# Patient Record
Sex: Male | Born: 1990 | Race: White | Hispanic: No | Marital: Married | Smoking: Former smoker
Health system: Southern US, Community
[De-identification: ages and names within clinical notes are randomized; demographics above are authoritative.]

## PROBLEM LIST (undated history)

## (undated) DIAGNOSIS — F1111 Opioid abuse, in remission: Secondary | ICD-10-CM

## (undated) HISTORY — DX: Opioid abuse, in remission: F11.11

---

## 2018-11-20 ENCOUNTER — Other Ambulatory Visit: Payer: Self-pay | Admitting: Primary Care

## 2018-11-20 ENCOUNTER — Ambulatory Visit (INDEPENDENT_AMBULATORY_CARE_PROVIDER_SITE_OTHER): Payer: No Typology Code available for payment source | Admitting: Primary Care

## 2018-11-20 ENCOUNTER — Encounter: Payer: Self-pay | Admitting: Primary Care

## 2018-11-20 ENCOUNTER — Other Ambulatory Visit: Payer: Self-pay

## 2018-11-20 VITALS — BP 116/82 | HR 76 | Temp 97.9°F | Ht 75.75 in | Wt 193.2 lb

## 2018-11-20 DIAGNOSIS — R7989 Other specified abnormal findings of blood chemistry: Secondary | ICD-10-CM

## 2018-11-20 DIAGNOSIS — Z0000 Encounter for general adult medical examination without abnormal findings: Secondary | ICD-10-CM | POA: Insufficient documentation

## 2018-11-20 LAB — COMPREHENSIVE METABOLIC PANEL
ALT: 439 U/L — ABNORMAL HIGH (ref 0–53)
AST: 251 U/L — ABNORMAL HIGH (ref 0–37)
Albumin: 4.7 g/dL (ref 3.5–5.2)
Alkaline Phosphatase: 87 U/L (ref 39–117)
BUN: 17 mg/dL (ref 6–23)
CO2: 29 mEq/L (ref 19–32)
Calcium: 9.3 mg/dL (ref 8.4–10.5)
Chloride: 103 mEq/L (ref 96–112)
Creatinine, Ser: 1 mg/dL (ref 0.40–1.50)
GFR: 89.49 mL/min (ref >60.00)
Glucose, Bld: 90 mg/dL (ref 70–99)
Potassium: 4.5 mEq/L (ref 3.5–5.1)
Sodium: 140 mEq/L (ref 135–145)
Total Bilirubin: 0.9 mg/dL (ref 0.2–1.2)
Total Protein: 7 g/dL (ref 6.0–8.3)

## 2018-11-20 LAB — LIPID PANEL
Cholesterol: 111 mg/dL (ref 0–200)
HDL: 48.3 mg/dL (ref >39.00)
LDL Cholesterol: 53 mg/dL (ref 0–99)
NonHDL: 62.86
Total CHOL/HDL Ratio: 2
Triglycerides: 50 mg/dL (ref 0.0–149.0)
VLDL: 10 mg/dL (ref 0.0–40.0)

## 2018-11-20 NOTE — Patient Instructions (Signed)
Stop by the lab prior to leaving today. I will notify you of your results once received.   Continue exercising. You should be getting 150 minutes of moderate intensity exercise weekly.  Continue to eat a healthy diet.   Ensure you are consuming 64 ounces of water daily.  It was a pleasure to meet you today! Please don't hesitate to call or message me with any questions. Welcome to [facility]!   Preventive Care 49-27 Years Old, Male Preventive care refers to lifestyle choices and visits with your health care provider that can promote health and wellness. This includes:  A yearly physical exam. This is also called an annual well check.  Regular dental and eye exams.  Immunizations.  Screening for certain conditions.  Healthy lifestyle choices, such as eating a healthy diet, getting regular exercise, not using drugs or products that contain nicotine and tobacco, and limiting alcohol use. What can I expect for my preventive care visit? Physical exam Your health care provider will check:  Height and weight. These may be used to calculate body mass index (BMI), which is a measurement that tells if you are at a healthy weight.  Heart rate and blood pressure.  Your skin for abnormal spots. Counseling Your health care provider may ask you questions about:  Alcohol, tobacco, and drug use.  Emotional well-being.  Home and relationship well-being.  Sexual activity.  Eating habits.  Work and work Statistician. What immunizations do I need?  Influenza (flu) vaccine  This is recommended every year. Tetanus, diphtheria, and pertussis (Tdap) vaccine  You may need a Td booster every 10 years. Varicella (chickenpox) vaccine  You may need this vaccine if you have not already been vaccinated. Human papillomavirus (HPV) vaccine  If recommended by your health care provider, you may need three doses over 6 months. Measles, mumps, and rubella (MMR) vaccine  You may need at least  one dose of MMR. You may also need a second dose. Meningococcal conjugate (MenACWY) vaccine  One dose is recommended if you are 90-85 years old and a Market researcher living in a residence hall, or if you have one of several medical conditions. You may also need additional booster doses. Pneumococcal conjugate (PCV13) vaccine  You may need this if you have certain conditions and were not previously vaccinated. Pneumococcal polysaccharide (PPSV23) vaccine  You may need one or two doses if you smoke cigarettes or if you have certain conditions. Hepatitis A vaccine  You may need this if you have certain conditions or if you travel or work in places where you may be exposed to hepatitis A. Hepatitis B vaccine  You may need this if you have certain conditions or if you travel or work in places where you may be exposed to hepatitis B. Haemophilus influenzae type b (Hib) vaccine  You may need this if you have certain risk factors. You may receive vaccines as individual doses or as more than one vaccine together in one shot (combination vaccines). Talk with your health care provider about the risks and benefits of combination vaccines. What tests do I need? Blood tests  Lipid and cholesterol levels. These may be checked every 5 years starting at age 54.  Hepatitis C test.  Hepatitis B test. Screening   Diabetes screening. This is done by checking your blood sugar (glucose) after you have not eaten for a while (fasting).  Sexually transmitted disease (STD) testing. Talk with your health care provider about your test results, treatment options, and  if necessary, the need for more tests. Follow these instructions at home: Eating and drinking   Eat a diet that includes fresh fruits and vegetables, whole grains, lean protein, and low-fat dairy products.  Take vitamin and mineral supplements as recommended by your health care provider.  Do not drink alcohol if your health care  provider tells you not to drink.  If you drink alcohol: ? Limit how much you have to 0-2 drinks a day. ? Be aware of how much alcohol is in your drink. In the [place] one drink equals one 12 oz bottle of beer (355 mL), one 5 oz glass of wine (148 mL), or one 1 oz glass of hard liquor (44 mL). Lifestyle  Take daily care of your teeth and gums.  Stay active. Exercise for at least 30 minutes on 5 or more days each week.  Do not use any products that contain nicotine or tobacco, such as cigarettes, e-cigarettes, and chewing tobacco. If you need help quitting, ask your health care provider.  If you are sexually active, practice safe sex. Use a condom or other form of protection to prevent STIs (sexually transmitted infections). What's next?  Go to your health care provider once a year for a well check visit.  Ask your health care provider how often you should have your eyes and teeth checked.  Stay up to date on all vaccines. This information is not intended to replace advice given to you by your health care provider. Make sure you discuss any questions you have with your health care provider. Document Released: 06/08/2001 Document Revised: 04/06/2018 Document Reviewed: 04/06/2018 Elsevier Patient Education  2020 [employer].

## 2018-11-20 NOTE — Progress Notes (Signed)
Subjective:    Patient ID: Carl Carson, male    DOB: 1990/10/01, 28 y.o.   MRN: [id number]  HPI  Carl Carson is a 28 year old male who presents today to establish care and for complete physical. Will obtain/review records.  Immunizations: -Tetanus: Unsure, believes it's been within 10 years.  -Influenza: Due this season   Diet: He endorses a healthy diet. He endorses plenty of vegetables, fruit, whole grains, lean protein. Exercise: He is starting to run again. 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   Eye exam: Completed in 2019 Dental exam: Completes annually as of recent.   BP Readings from Last 3 Encounters:  11/20/18 116/82     Review of Systems  Constitutional: Negative for unexpected weight change.  HENT: Negative for rhinorrhea.   Respiratory: Negative for cough and shortness of breath.   Cardiovascular: Negative for chest pain.  Gastrointestinal: Negative for constipation and diarrhea.  Genitourinary: Negative for difficulty urinating.  Musculoskeletal: Negative for arthralgias and myalgias.  Skin: Negative for rash.  Allergic/Immunologic: Negative for environmental allergies.  Neurological: Negative for dizziness and headaches.  Psychiatric/Behavioral: The patient is not nervous/anxious.        History reviewed. No pertinent past medical history.   Social History   Socioeconomic History  . Marital status: Married    Spouse name: Not on file  . Number of children: Not on file  . Years of education: Not on file  . Highest education level: Not on file  Occupational History  . Not on file  Social Needs  . Financial resource strain: Not on file  . Food insecurity    Worry: Not on file    Inability: Not on file  . Transportation needs    Medical: Not on file    Non-medical: Not on file  Tobacco Use  . Smoking status: Former Research scientist (life sciences)  . Smokeless tobacco: Never Used  Substance and Sexual Activity  . Alcohol use: Yes  . Drug use: Not on  file  . Sexual activity: Not on file  Lifestyle  . Physical activity    Days per week: Not on file    Minutes per session: Not on file  . Stress: Not on file  Relationships  . Social Herbalist on phone: Not on file    Gets together: Not on file    Attends religious service: Not on file    Active member of club or organization: Not on file    Attends meetings of clubs or organizations: Not on file    Relationship status: Not on file  . Intimate partner violence    Fear of current or ex partner: Not on file    Emotionally abused: Not on file    Physically abused: Not on file    Forced sexual activity: Not on file  Other Topics Concern  . Not on file  Social History Narrative   Married.   No children.   Works as a Chief Strategy Officer, also to be starting with Research officer, trade union.     History reviewed. No pertinent surgical history.  Family History  Problem Relation Age of Onset  . Supraventricular tachycardia Mother   . Hypertension Father     No Known Allergies  Current Outpatient Medications on File Prior to Visit  Medication Sig Dispense Refill  . Multiple Vitamin (MULTIVITAMIN) tablet Take 1 tablet by mouth daily.     No current facility-administered medications on file prior to visit.  BP 116/82   Pulse 76   Temp 97.9 F (36.6 C) (Temporal)   Ht 6' 3.75" (1.924 m)   Wt 193 lb 4 oz (87.7 kg)   SpO2 100%   BMI 23.68 kg/m    Objective:   Physical Exam  Constitutional: He is oriented to person, place, and time. He appears well-nourished.  HENT:  Mouth/Throat: No oropharyngeal exudate.  Eyes: Pupils are equal, round, and reactive to light. EOM are normal.  Neck: Neck supple. No thyromegaly present.  Cardiovascular: Normal rate and regular rhythm.  Respiratory: Effort normal and breath sounds normal.  GI: Soft. Bowel sounds are normal. There is no abdominal tenderness.  Musculoskeletal: Normal range of motion.  Neurological: He is alert and oriented to  person, place, and time.  Skin: Skin is warm and dry.  Psychiatric: He has a normal mood and affect.           Assessment & Plan:

## 2018-11-20 NOTE — Assessment & Plan Note (Signed)
Tetanus UTD per patient. Encouraged regular exercise and healthy diet. Exam today unremarkable. Labs pending. Follow up in 1 year for CPE.

## 2019-09-27 ENCOUNTER — Other Ambulatory Visit: Payer: Self-pay | Admitting: Primary Care

## 2019-09-27 DIAGNOSIS — Z0000 Encounter for general adult medical examination without abnormal findings: Secondary | ICD-10-CM

## 2019-09-27 DIAGNOSIS — Z114 Encounter for screening for human immunodeficiency virus [HIV]: Secondary | ICD-10-CM

## 2019-09-27 DIAGNOSIS — R7989 Other specified abnormal findings of blood chemistry: Secondary | ICD-10-CM

## 2019-10-10 ENCOUNTER — Telehealth: Payer: Self-pay

## 2019-10-10 NOTE — Telephone Encounter (Signed)
[facility] Night - Client Nonclinical Telephone Record AccessNurse Client [facility] Night - Client Client Site [facility] - Night Physician Vernona Rieger - NP Contact Type Call Who Is Calling Patient / Member / Family / Caregiver Caller Name Carl Carson Caller Phone Number [phone] Patient Name Carl Carson Patient DOB 06-19-1990 Call Type Message Only Information Provided Reason for Call Request for General Office Information Initial Comment Caller states he has an appointment for labs tomorrow and would like to combine his appointment on the 22nd with his labs. He is concerned he will not be able to make it to the lab tomorrow morning. Additional Comment Caller states he will call back during office hours. Disp. Time Disposition Final User 10/10/2019 7:34:50 AM General Information Provided Yes Doug Sou Call Closed By: Doug Sou Transaction Date/Time: 10/10/2019 7:32:00 AM (ET)

## 2019-10-11 ENCOUNTER — Other Ambulatory Visit: Payer: No Typology Code available for payment source

## 2019-10-16 ENCOUNTER — Other Ambulatory Visit: Payer: Self-pay

## 2019-10-16 ENCOUNTER — Ambulatory Visit (INDEPENDENT_AMBULATORY_CARE_PROVIDER_SITE_OTHER): Payer: No Typology Code available for payment source | Admitting: Primary Care

## 2019-10-16 ENCOUNTER — Encounter: Payer: Self-pay | Admitting: Primary Care

## 2019-10-16 VITALS — BP 122/84 | HR 62 | Temp 96.6°F | Ht 75.75 in | Wt 210.5 lb

## 2019-10-16 DIAGNOSIS — Z0000 Encounter for general adult medical examination without abnormal findings: Secondary | ICD-10-CM

## 2019-10-16 DIAGNOSIS — R7989 Other specified abnormal findings of blood chemistry: Secondary | ICD-10-CM

## 2019-10-16 LAB — COMPREHENSIVE METABOLIC PANEL
ALT: 164 U/L — ABNORMAL HIGH (ref 0–53)
AST: 111 U/L — ABNORMAL HIGH (ref 0–37)
Albumin: 4.9 g/dL (ref 3.5–5.2)
Alkaline Phosphatase: 67 U/L (ref 39–117)
BUN: 13 mg/dL (ref 6–23)
CO2: 30 mEq/L (ref 19–32)
Calcium: 9.6 mg/dL (ref 8.4–10.5)
Chloride: 103 mEq/L (ref 96–112)
Creatinine, Ser: 1 mg/dL (ref 0.40–1.50)
GFR: 88.9 mL/min (ref >60.00)
Glucose, Bld: 93 mg/dL (ref 70–99)
Potassium: 4.3 mEq/L (ref 3.5–5.1)
Sodium: 139 mEq/L (ref 135–145)
Total Bilirubin: 0.8 mg/dL (ref 0.2–1.2)
Total Protein: 7.5 g/dL (ref 6.0–8.3)

## 2019-10-16 LAB — CBC
HCT: 43.7 % (ref 39.0–52.0)
Hemoglobin: 15.1 g/dL (ref 13.0–17.0)
MCHC: 34.5 g/dL (ref 30.0–36.0)
MCV: 94.8 fl (ref 78.0–100.0)
Platelets: 175 10*3/uL (ref 150.0–400.0)
RBC: 4.61 Mil/uL (ref 4.22–5.81)
RDW: 12.6 % (ref 11.5–15.5)
WBC: 4.1 10*3/uL (ref 4.0–10.5)

## 2019-10-16 LAB — LIPID PANEL
Cholesterol: 121 mg/dL (ref 0–200)
HDL: 50 mg/dL (ref >39.00)
LDL Cholesterol: 52 mg/dL (ref 0–99)
NonHDL: 71
Total CHOL/HDL Ratio: 2
Triglycerides: 93 mg/dL (ref 0.0–149.0)
VLDL: 18.6 mg/dL (ref 0.0–40.0)

## 2019-10-16 NOTE — Assessment & Plan Note (Signed)
Grossly elevated with 3+ times the upper limits of normal. History of IV drug use.  Checking Hepatitis panel, HIV, CBC, CMP. No alcohol abuse.  He is taking a supplement that may or may not be contributing.

## 2019-10-16 NOTE — Progress Notes (Signed)
Subjective:    Patient ID: Carl Carson, male    DOB: 1991/02/15, 29 y.o.   MRN: [id number]  HPI  This visit occurred during the SARS-CoV-2 public health emergency.  Safety protocols were in place, including screening questions prior to the visit, additional usage of staff PPE, and extensive cleaning of exam room while observing appropriate contact time as indicated for disinfecting solutions.   Carl Carson is a 29 year old male who presents today for complete physical.  Immunizations: -Tetanus: Completed in 2012 -Influenza: Did not receive last visit  -Covid-19: Completed series  Diet: He endorses a healthy diet.  Exercise: He is active at work, no regular exercise.   Eye exam: Completed in 2020 Dental exam: Completes regularly.   BP Readings from Last 3 Encounters:  10/16/19 122/84  11/20/18 116/82   He is taking Kratom supplement for energy and pain, has been taking this for about two years. During his labs last year he was at a high dose, he is now down to 40% of what he was taking last year.   Review of Systems  Constitutional: Negative for unexpected weight change.  HENT: Negative for rhinorrhea.   Respiratory: Negative for cough and shortness of breath.   Cardiovascular: Negative for chest pain.  Gastrointestinal: Negative for constipation and diarrhea.  Genitourinary: Negative for difficulty urinating.  Musculoskeletal: Negative for arthralgias and myalgias.  Skin: Negative for rash.  Allergic/Immunologic: Negative for environmental allergies.  Neurological: Negative for dizziness, numbness and headaches.  Psychiatric/Behavioral: The patient is not nervous/anxious.        Past Medical History:  Diagnosis Date  . 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 Marital status: Married    Spouse name: Not on file  . Number of children: Not on file  . Years of education: Not on file  .  Highest education level: Not on file  Occupational History  . Not on file  Tobacco Use  . Smoking status: Former Research scientist (life sciences)  . Smokeless tobacco: Never Used  Substance and Sexual Activity  . Alcohol use: Yes  . Drug use: Not on file  . Sexual activity: Not on file  Other Topics Concern  . Not on file  Social History Narrative   Married.   No children.   Works as a Chief Strategy Officer, also to be starting with Research officer, trade union.    Social Determinants of Health   Financial Resource Strain:   . Difficulty of Paying Living Expenses:   Food Insecurity:   . Worried About Charity fundraiser in the Last Year:   . Arboriculturist in the Last Year:   Transportation Needs:   . Film/video editor (Medical):   Marland Kitchen Lack of Transportation (Non-Medical):   Physical Activity:   . Days of Exercise per Week:   . Minutes of Exercise per Session:   Stress:   . Feeling of Stress :   Social Connections:   . Frequency of Communication with Friends and Family:   . Frequency of Social Gatherings with Friends and Family:   . Attends Religious Services:   . Active Member of Clubs or Organizations:   . Attends Archivist Meetings:   Marland Kitchen Marital Status:   Intimate Partner Violence:   . Fear of Current or Ex-Partner:   . Emotionally Abused:   Marland Kitchen Physically Abused:   . Sexually Abused:  No past surgical history on file.  Family History  Problem Relation Age of Onset  . Supraventricular tachycardia Mother   . Hypertension Father     No Known Allergies  Current Outpatient Medications on File Prior to Visit  Medication Sig Dispense Refill  . Multiple Vitamin (MULTIVITAMIN) tablet Take 1 tablet by mouth daily.     No current facility-administered medications on file prior to visit.    BP 122/84   Pulse 62   Temp (!) 96.6 F (35.9 C) (Temporal)   Ht 6' 3.75" (1.924 m)   Wt 210 lb 8 oz (95.5 kg)   SpO2 98%   BMI 25.79 kg/m    Objective:   Physical Exam  Constitutional: He is  oriented to person, place, and time.  HENT:  Right Ear: Tympanic membrane and ear canal normal.  Left Ear: Tympanic membrane and ear canal normal.  Eyes: Pupils are equal, round, and reactive to light.  Cardiovascular: Normal rate and regular rhythm.  Respiratory: Effort normal and breath sounds normal.  GI: Soft. Bowel sounds are normal. There is no abdominal tenderness.  Musculoskeletal:        General: Normal range of motion.     Cervical back: Neck supple.  Neurological: He is alert and oriented to person, place, and time. No cranial nerve deficit.  Reflex Scores:      Patellar reflexes are 2+ on the right side and 2+ on the left side. Skin: Skin is warm and dry.           Assessment & Plan:

## 2019-10-16 NOTE — Patient Instructions (Signed)
Stop by the lab prior to leaving today. I will notify you of your results once received.   It's important to improve your diet by reducing consumption of fast food, fried food, processed snack foods, sugary drinks. Increase consumption of fresh vegetables and fruits, whole grains, water.  Ensure you are drinking 64 ounces of water daily.  Start exercising. You should be getting 150 minutes of moderate intensity exercise weekly.  It was a pleasure to see you today!   Preventive Care 22-57 Years Old, Male Preventive care refers to lifestyle choices and visits with your health care provider that can promote health and wellness. This includes:  A yearly physical exam. This is also called an annual well check.  Regular dental and eye exams.  Immunizations.  Screening for certain conditions.  Healthy lifestyle choices, such as eating a healthy diet, getting regular exercise, not using drugs or products that contain nicotine and tobacco, and limiting alcohol use. What can I expect for my preventive care visit? Physical exam Your health care provider will check:  Height and weight. These may be used to calculate body mass index (BMI), which is a measurement that tells if you are at a healthy weight.  Heart rate and blood pressure.  Your skin for abnormal spots. Counseling Your health care provider may ask you questions about:  Alcohol, tobacco, and drug use.  Emotional well-being.  Home and relationship well-being.  Sexual activity.  Eating habits.  Work and work Statistician. What immunizations do I need?  Influenza (flu) vaccine  This is recommended every year. Tetanus, diphtheria, and pertussis (Tdap) vaccine  You may need a Td booster every 10 years. Varicella (chickenpox) vaccine  You may need this vaccine if you have not already been vaccinated. Human papillomavirus (HPV) vaccine  If recommended by your health care provider, you may need three doses over 6  months. Measles, mumps, and rubella (MMR) vaccine  You may need at least one dose of MMR. You may also need a second dose. Meningococcal conjugate (MenACWY) vaccine  One dose is recommended if you are 38-7 years old and a Market researcher living in a residence hall, or if you have one of several medical conditions. You may also need additional booster doses. Pneumococcal conjugate (PCV13) vaccine  You may need this if you have certain conditions and were not previously vaccinated. Pneumococcal polysaccharide (PPSV23) vaccine  You may need one or two doses if you smoke cigarettes or if you have certain conditions. Hepatitis A vaccine  You may need this if you have certain conditions or if you travel or work in places where you may be exposed to hepatitis A. Hepatitis B vaccine  You may need this if you have certain conditions or if you travel or work in places where you may be exposed to hepatitis B. Haemophilus influenzae type b (Hib) vaccine  You may need this if you have certain risk factors. You may receive vaccines as individual doses or as more than one vaccine together in one shot (combination vaccines). Talk with your health care provider about the risks and benefits of combination vaccines. What tests do I need? Blood tests  Lipid and cholesterol levels. These may be checked every 5 years starting at age 60.  Hepatitis C test.  Hepatitis B test. Screening   Diabetes screening. This is done by checking your blood sugar (glucose) after you have not eaten for a while (fasting).  Sexually transmitted disease (STD) testing. Talk with your health care  provider about your test results, treatment options, and if necessary, the need for more tests. Follow these instructions at home: Eating and drinking   Eat a diet that includes fresh fruits and vegetables, whole grains, lean protein, and low-fat dairy products.  Take vitamin and mineral supplements as  recommended by your health care provider.  Do not drink alcohol if your health care provider tells you not to drink.  If you drink alcohol: ? Limit how much you have to 0-2 drinks a day. ? Be aware of how much alcohol is in your drink. In the [place] one drink equals one 12 oz bottle of beer (355 mL), one 5 oz glass of wine (148 mL), or one 1 oz glass of hard liquor (44 mL). Lifestyle  Take daily care of your teeth and gums.  Stay active. Exercise for at least 30 minutes on 5 or more days each week.  Do not use any products that contain nicotine or tobacco, such as cigarettes, e-cigarettes, and chewing tobacco. If you need help quitting, ask your health care provider.  If you are sexually active, practice safe sex. Use a condom or other form of protection to prevent STIs (sexually transmitted infections). What's next?  Go to your health care provider once a year for a well check visit.  Ask your health care provider how often you should have your eyes and teeth checked.  Stay up to date on all vaccines. This information is not intended to replace advice given to you by your health care provider. Make sure you discuss any questions you have with your health care provider. Document Revised: 04/06/2018 Document Reviewed: 04/06/2018 Elsevier Patient Education  2020 [employer].

## 2019-10-16 NOTE — Assessment & Plan Note (Signed)
Immunizations UTD. Discussed the importance of a healthy diet and regular exercise in order for weight loss, and to reduce the risk of any potential medical problems.  Exam today unremarkable. Labs pending. 

## 2019-10-19 LAB — HEPATITIS PANEL, ACUTE
Hep A IgM: NONREACTIVE
Hep B C IgM: NONREACTIVE
Hepatitis B Surface Ag: NONREACTIVE
Hepatitis C Ab: REACTIVE — AB
SIGNAL TO CUT-OFF: 31.2 — ABNORMAL HIGH (ref <1.00)

## 2019-10-19 LAB — HCV RNA,QUANTITATIVE REAL TIME PCR
HCV Quantitative Log: 6.82 Log IU/mL — ABNORMAL HIGH
HCV RNA, PCR, QN: 6660000 IU/mL — ABNORMAL HIGH

## 2019-10-19 LAB — HIV ANTIBODY (ROUTINE TESTING W REFLEX): HIV 1&2 Ab, 4th Generation: NONREACTIVE

## 2019-10-22 DIAGNOSIS — K732 Chronic active hepatitis, not elsewhere classified: Secondary | ICD-10-CM

## 2019-11-05 NOTE — Telephone Encounter (Signed)
Carl Carson, [place]. Hep c clinic referral.

## 2019-11-13 ENCOUNTER — Ambulatory Visit: Payer: No Typology Code available for payment source | Admitting: Infectious Diseases

## 2019-11-16 ENCOUNTER — Other Ambulatory Visit: Payer: No Typology Code available for payment source

## 2019-11-20 ENCOUNTER — Other Ambulatory Visit
Admission: RE | Admit: 2019-11-20 | Discharge: 2019-11-20 | Disposition: A | Discharge status: Home / Self Care | Payer: No Typology Code available for payment source | Source: Ambulatory Visit | Attending: Infectious Diseases | Admitting: Infectious Diseases

## 2019-11-20 ENCOUNTER — Encounter: Payer: Self-pay | Admitting: Infectious Diseases

## 2019-11-20 ENCOUNTER — Ambulatory Visit: Payer: No Typology Code available for payment source | Attending: Infectious Diseases | Admitting: Infectious Diseases

## 2019-11-20 ENCOUNTER — Other Ambulatory Visit: Payer: Self-pay

## 2019-11-20 VITALS — BP 144/70 | HR 88 | Temp 98.1°F | Resp 17 | Ht 73.4 in | Wt 210.0 lb

## 2019-11-20 DIAGNOSIS — B182 Chronic viral hepatitis C: Secondary | ICD-10-CM | POA: Insufficient documentation

## 2019-11-20 DIAGNOSIS — Z87891 Personal history of nicotine dependence: Secondary | ICD-10-CM | POA: Diagnosis not present

## 2019-11-20 DIAGNOSIS — F1990 Other psychoactive substance use, unspecified, uncomplicated: Secondary | ICD-10-CM | POA: Insufficient documentation

## 2019-11-20 LAB — HEPATIC FUNCTION PANEL
ALT: 160 U/L — ABNORMAL HIGH (ref 0–44)
AST: 100 U/L — ABNORMAL HIGH (ref 15–41)
Albumin: 4.8 g/dL (ref 3.5–5.0)
Alkaline Phosphatase: 62 U/L (ref 38–126)
Bilirubin, Direct: 0.2 mg/dL (ref 0.0–0.2)
Indirect Bilirubin: 1 mg/dL — ABNORMAL HIGH (ref 0.3–0.9)
Total Bilirubin: 1.2 mg/dL (ref 0.3–1.2)
Total Protein: 7.6 g/dL (ref 6.5–8.1)

## 2019-11-20 LAB — APTT: aPTT: 48 seconds — ABNORMAL HIGH (ref 24–36)

## 2019-11-20 LAB — PROTIME-INR
INR: 1.1 (ref 0.8–1.2)
Prothrombin Time: 13.4 seconds (ref 11.4–15.2)

## 2019-11-20 LAB — HEPATITIS A ANTIBODY, TOTAL: hep A Total Ab: NONREACTIVE

## 2019-11-20 LAB — TSH: TSH: 3.973 u[IU]/mL (ref 0.350–4.500)

## 2019-11-20 NOTE — Patient Instructions (Signed)
You are here for HEPC visit- today will do some labs including HEPC genotype.-need an ultrasound elastography.  depending on the results will decide on treatment.

## 2019-11-20 NOTE — Progress Notes (Signed)
NAME: Carl Carson  DOB: 05/24/1990  MRN: [id number]  Date/Time: 11/20/2019 11:43 AM  REQUESTING PROVIDER: Vernona Rieger Subjective:  REASON FOR CONSULT: HEPC infection ? Carl Carson is a 29 y.o.male  with a past history of IV heroin use clean for 3 years since 2018 , abnormal LFTS, recently tested for hepatitis C and is positive is referred to me for treatment Pt has no significant medical hisotry While doing heroin IV he did not have any MRSA infection or valve infection. HE has some fatigue and body aches and he buys kratom thru the web .He takes Kratom powder pressed into capsules.   ( He also drinks wine during the wekeends when he goes to [place] where his father owns a winery ( [place] in [place]) His wife is a Psychologist, prison and probation services here at [facility] He has a 28 month old baby  Past Medical History:  Diagnosis Date  . History of heroin abuse (HCC)   . 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 No pertinent surgical history.  Social History   Socioeconomic History  . Marital status: Married    Spouse name: Not on file  . Number of children: Not on file  . Years of education: Not on file  . Highest education level: Not on file  Occupational History  . Not on file  Tobacco Use  . Smoking status: Former Games developer  . Smokeless tobacco: Never Used  Substance and Sexual Activity  . Alcohol use: Yes  . Drug use: Not on file  . Sexual activity: Not on file  Other Topics Concern  . Not on file  Social History Narrative   Married.   No children.   Works as a Surveyor, minerals, also to be starting with Warden/ranger.    Social Determinants of Health   Financial Resource Strain:   . Difficulty of Paying Living Expenses:   Food Insecurity:   . Worried About Programme researcher, broadcasting/film/video in the Last Year:   . Barista in the Last Year:   Transportation Needs:   . Freight forwarder (Medical):   Marland Kitchen Lack of Transportation  (Non-Medical):   Physical Activity:   . Days of Exercise per Week:   . Minutes of Exercise per Session:   Stress:   . Feeling of Stress :   Social Connections:   . Frequency of Communication with Friends and Family:   . Frequency of Social Gatherings with Friends and Family:   . Attends Religious Services:   . Active Member of Clubs or Organizations:   . Attends Banker Meetings:   Marland Kitchen Marital Status:   Intimate Partner Violence:   . Fear of Current or Ex-Partner:   . Emotionally Abused:   Marland Kitchen Physically Abused:   . Sexually Abused:     Family History  Problem Relation Age of Onset  . Supraventricular tachycardia Mother   . Hypertension Father    No Known Allergies  ? Current Outpatient Medications  Medication Sig Dispense Refill  . Multiple Vitamin (MULTIVITAMIN) tablet Take 1 tablet by mouth daily.     No current facility-administered medications for this visit.     Abtx:  Anti-infectives (From admission, onward)   None      REVIEW OF SYSTEMS:  Const: negative fever, negative chills, negative weight loss Eyes: negative diplopia or visual changes, negative eye pain ENT: negative coryza, negative sore throat Resp: negative cough, hemoptysis,  dyspnea Cards: negative for chest pain, palpitations, lower extremity edema GU: negative for frequency, dysuria and hematuria GI: Negative for abdominal pain, diarrhea, bleeding, constipation Skin: negative for rash and pruritus Heme: negative for easy bruising and gum/nose bleeding MS:  myalgias, arthralgias, back pain for which he takes Kratom and also occasional Ibuprofen Neurolo:negative for headaches, dizziness, vertigo, memory problems  Psych: negative for feelings of anxiety, depression  Endocrine: negative for thyroid, diabetes Allergy/Immunology- negative for any medication or food allergies ?  Objective:  VITALS:  BP (!) 144/70   Pulse 88   Temp 98.1 F (36.7 C)   Resp 17   Ht 6' 1.4" (1.864 m)    Wt (!) 210 lb (95.3 kg)   SpO2 98%   BMI 27.41 kg/m  PHYSICAL EXAM:  General: Alert, cooperative, no distress, appears stated age.  Head: Normocephalic, without obvious abnormality, atraumatic. Eyes: Conjunctivae clear, anicteric sclerae. Pupils are equal ENT Nares normal. No drainage or sinus tenderness. Lips, mucosa, and tongue normal. No Thrush Neck: Supple, symmetrical, no adenopathy, thyroid: non tender no carotid bruit and no JVD. Back: No CVA tenderness. Lungs: Clear to auscultation bilaterally. No Wheezing or Rhonchi. No rales. Heart: Regular rate and rhythm, no murmur, rub or gallop. Abdomen: Soft, non-tender,not distended. Bowel sounds normal. Liver felt 1 finger breadth below the rt coastal margin Extremities: atraumatic, no cyanosis. No edema. No clubbing Skin: No rashes or lesions. Or bruising Lymph: Cervical, supraclavicular normal. Neurologic: Grossly non-focal Pertinent Labs    Tests result DATE comment  HEPC RNA 6.72million copies 10/16/19   HEPC  Genotype     Hepatitis B profile Surface ag, cab neg 10/16/19 Need hep b surface antibody  Hepatitis A status igM neg    PT/PTT     AST, ALT, Bilirubin 111,164,0.8    Albumin 4.9    creatinine 1    HB/Platelet 15/175    HIV NR    AFP     TSH     comorbidities      tobacco yes    alcohol yes    drug kratom    APRI Score 1.714    FIB 4 score 1.39    Current meds NSAID    Ultrasound Elastography     HCV Fibrosure           CBC    Component Value Date/Time   WBC 4.1 10/16/2019 0919   RBC 4.61 10/16/2019 0919   HGB 15.1 10/16/2019 0919   HCT 43.7 10/16/2019 0919   PLT 175.0 10/16/2019 0919   MCV 94.8 10/16/2019 0919   MCHC 34.5 10/16/2019 0919   RDW 12.6 10/16/2019 0919    CMP Latest Ref Rng & Units 10/16/2019 11/20/2018  Glucose 70 - 99 mg/dL 93 90  BUN 6 - 23 mg/dL 13 17  Creatinine 0.73 - 1.50 mg/dL 7.10 6.26  Sodium 948 - 145 mEq/L 139 140  Potassium 3.5 - 5.1 mEq/L 4.3 4.5  Chloride 96 - 112  mEq/L 103 103  CO2 19 - 32 mEq/L 30 29  Calcium 8.4 - 10.5 mg/dL 9.6 9.3  Total Protein 6.0 - 8.3 g/dL 7.5 7.0  Total Bilirubin 0.2 - 1.2 mg/dL 0.8 0.9  Alkaline Phos 39 - 117 U/L 67 87  AST 0 - 37 U/L 111(H) 251(H)  ALT 0 - 53 U/L 164(H) 439(H)    IMAGING RESULTS: none ? Impression/Recommendation ? Active, chronic  hepatitis c infection-with no evidence of cirrhosis or decompensation clinically  ex IVDA, clean in 3  years Transaminitis Need genotype, PT/PTT, TSH, fibrosure Ultrasound elastography Advised to avoid any hepatotoxic drugs including alcohol Takes Kratom a psychotropic substance obtained over WEB. Kratom is a from a tree Mitragyna speciosa native to [place], with leaves that contain compounds that can have psychotropic (mind-altering) effects.Two compounds in kratom leaves, mitragynine and 7-?-hydroxymitragynine, interact with opioid receptors in the brain, producing sedation, pleasure, and decreased pain, especially when users consume large amounts of the plant. Mitragynine also interacts with other receptor systems in the brain to produce stimulant effects. When kratom is taken in small amounts, users report increased energy, sociability, and alertness instead of sedation) Will need to see whether there is any interaction with HEPC meds Sofosbuvir and velpatasvir Dorita Fray) or Hewitt Blade  Will see him once the results are available ? ___________________________________________________ Discussed with patient

## 2019-11-21 LAB — HEPATITIS B SURFACE ANTIBODY, QUANTITATIVE: Hep B S AB Quant (Post): 77.1 m[IU]/mL (ref >9.9)

## 2019-11-21 LAB — AFP TUMOR MARKER: AFP, Serum, Tumor Marker: 2.1 ng/mL (ref 0.0–8.3)

## 2019-11-22 LAB — HEPATITIS C GENOTYPE

## 2019-11-23 ENCOUNTER — Encounter: Payer: No Typology Code available for payment source | Admitting: Primary Care

## 2019-11-23 LAB — HCV FIBROSURE
ALPHA 2-MACROGLOBULINS, QN: 194 mg/dL (ref 110–276)
ALT (SGPT) P5P: 181 IU/L — ABNORMAL HIGH (ref 0–55)
Apolipoprotein A-1: 142 mg/dL (ref 101–178)
Bilirubin, Total: 0.8 mg/dL (ref 0.0–1.2)
Fibrosis Score: 0.26 — ABNORMAL HIGH (ref 0.00–0.21)
GGT: 61 IU/L (ref 0–65)
Haptoglobin: 95 mg/dL (ref 17–317)
Necroinflammat Activity Score: 0.77 — ABNORMAL HIGH (ref 0.00–0.17)

## 2019-11-29 ENCOUNTER — Telehealth: Payer: Self-pay

## 2019-11-29 NOTE — Telephone Encounter (Signed)
Scheduled Liver U/S for 8/16 8:45 arrival NPO midnight. Patient said he will call them at scheduling if needs to change date or time.

## 2019-12-10 ENCOUNTER — Ambulatory Visit: Admission: RE | Admit: 2019-12-10 | Payer: No Typology Code available for payment source | Source: Ambulatory Visit

## 2020-01-28 ENCOUNTER — Telehealth: Payer: Self-pay

## 2020-01-28 NOTE — Telephone Encounter (Signed)
Attempted to mychart message twice and called 2 times before also calling spouse Natalia Leatherwood and leaving message that patient needs to contact us regarding radiology appt as well as follow up with [facility].

## 2020-02-05 ENCOUNTER — Other Ambulatory Visit: Payer: Self-pay

## 2020-02-05 DIAGNOSIS — B182 Chronic viral hepatitis C: Secondary | ICD-10-CM

## 2020-03-17 ENCOUNTER — Telehealth: Payer: Self-pay

## 2020-03-17 ENCOUNTER — Other Ambulatory Visit: Payer: Self-pay | Admitting: Infectious Diseases

## 2020-03-17 DIAGNOSIS — B182 Chronic viral hepatitis C: Secondary | ICD-10-CM

## 2020-03-17 NOTE — Telephone Encounter (Signed)
Thanks. So his appt is now scheduled for the 6th??

## 2020-03-17 NOTE — Progress Notes (Signed)
Placed order for Ulrasound liver with elastography.

## 2020-03-17 NOTE — Telephone Encounter (Signed)
Spoke with patient and advised him of his [place] appointment on 04/02/20  arrival time 9:00 am at [facility] and NPO after midnight. Patient requested appointment be after 03/31/20 due to work. Patient verbalized understanding and was ok with appointment date and time. Phone number also given to patient if there is a need for him to change his appointment.  Heiley Shaikh T Pricilla Loveless

## 2020-03-18 NOTE — Telephone Encounter (Signed)
It is scheduled for 04/02/20

## 2020-03-24 ENCOUNTER — Ambulatory Visit: Payer: No Typology Code available for payment source

## 2020-04-02 ENCOUNTER — Other Ambulatory Visit: Payer: No Typology Code available for payment source

## 2020-04-02 ENCOUNTER — Other Ambulatory Visit: Payer: Self-pay

## 2020-04-02 ENCOUNTER — Ambulatory Visit
Admission: RE | Admit: 2020-04-02 | Discharge: 2020-04-02 | Disposition: A | Discharge status: Home / Self Care | Payer: No Typology Code available for payment source | Source: Ambulatory Visit | Attending: Infectious Diseases | Admitting: Infectious Diseases

## 2020-04-02 DIAGNOSIS — B182 Chronic viral hepatitis C: Secondary | ICD-10-CM

## 2020-04-03 ENCOUNTER — Ambulatory Visit
Admission: RE | Admit: 2020-04-03 | Discharge: 2020-04-03 | Disposition: A | Discharge status: Home / Self Care | Payer: No Typology Code available for payment source | Source: Ambulatory Visit | Attending: Infectious Diseases | Admitting: Infectious Diseases

## 2020-04-03 ENCOUNTER — Ambulatory Visit: Payer: No Typology Code available for payment source

## 2020-04-03 DIAGNOSIS — B182 Chronic viral hepatitis C: Secondary | ICD-10-CM | POA: Insufficient documentation

## 2020-04-10 ENCOUNTER — Ambulatory Visit: Payer: No Typology Code available for payment source | Attending: Infectious Diseases | Admitting: Infectious Diseases

## 2020-04-10 ENCOUNTER — Other Ambulatory Visit: Payer: Self-pay

## 2020-04-10 DIAGNOSIS — B182 Chronic viral hepatitis C: Secondary | ICD-10-CM | POA: Diagnosis not present

## 2020-04-10 NOTE — Progress Notes (Signed)
The purpose of this virtual visit is to provide medical care while limiting exposure to the novel coronavirus (COVID19) for both patient and office staff.   Consent was obtained for phone visit:  Yes.   Answered questions that patient had about telehealth interaction:  Yes.   I discussed the limitations, risks, security and privacy concerns of performing an evaluation and management service by telephone. I also discussed with the patient that there may be a patient responsible charge related to this service. The patient expressed understanding and agreed to proceed.   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 [place] on 04/03/20 Is here to discuss treatment Continues to do Kratom( buys it online)( mitragynine and 7-?-hydroxymitragynine) a substitute for opiod use -a stimulant and psychotropic herbal leaves  C/p of fatigue Irritability Nausea and cramping in the morning Still takes kratom -says 20% less than before Says he drinks 12 or more beer every week   Tests result DATE comment  HEPC RNA 6.33million copies 10/16/19   HEPC  Genotype     Hepatitis B profile Surface ag, cab neg 10/16/19 Need hep b surface antibody  Hepatitis A status igM neg    PT/PTT     AST, ALT, Bilirubin 111,164,0.8    Albumin 4.9    creatinine 1    HB/Platelet 15/175    HIV NR    AFP 2.1 11/20/19   TSH 3.973 11/20/19   comorbidities      tobacco yes    alcohol yes    drug kratom    APRI Score 1.714    FIB 4 score 1.39    Current meds NSAID    Ultrasound Elastography 9.5 KPA 04/03/20     Impression/recommendation Active hepatitits C infection with no cirrhosis but has  necroinflammatory score of A3 of Fibrosure [place] /elastograpy KPA 9.5- Fibrosis score F0-F1 Will check to see whetehr there is any interaction with kratom and epclusa Other wise he  will be eligible for Epclusa which is sofas+velpat for 12 weeks Explained to patient that his  irritability nausea/fatigue could be from Kratom leaves- pt says he cannot stop the kratom leaves which is a stimulant  Aske dhim to stop alcohol as his AST/ALT high because of alcohol and hepc and they both are more harmful to the liver in combimation Will also check for Drug interactions with Kratom and epclusa  Will discuss with [facility] pharmacist  HE will get LFTS /Bmp/CBC tomorrow Will fax lab order to [facility] [place] where he works Total time spent on the phone call was 23 mnutes

## 2020-04-11 ENCOUNTER — Other Ambulatory Visit: Payer: Self-pay

## 2020-04-11 DIAGNOSIS — B182 Chronic viral hepatitis C: Secondary | ICD-10-CM

## 2020-04-12 LAB — COMPREHENSIVE METABOLIC PANEL
ALT: 128 IU/L — ABNORMAL HIGH (ref 0–44)
AST: 116 IU/L — ABNORMAL HIGH (ref 0–40)
Albumin/Globulin Ratio: 2.2 (ref 1.2–2.2)
Albumin: 4.9 g/dL (ref 4.1–5.2)
Alkaline Phosphatase: 72 IU/L (ref 44–121)
BUN/Creatinine Ratio: 17 (ref 9–20)
BUN: 17 mg/dL (ref 6–20)
Bilirubin Total: 0.7 mg/dL (ref 0.0–1.2)
CO2: 22 mmol/L (ref 20–29)
Calcium: 8.9 mg/dL (ref 8.7–10.2)
Chloride: 101 mmol/L (ref 96–106)
Creatinine, Ser: 1.02 mg/dL (ref 0.76–1.27)
GFR calc Af Amer: 115 mL/min/{1.73_m2} (ref >59)
GFR calc non Af Amer: 100 mL/min/{1.73_m2} (ref >59)
Globulin, Total: 2.2 g/dL (ref 1.5–4.5)
Glucose: 86 mg/dL (ref 65–99)
Potassium: 4.3 mmol/L (ref 3.5–5.2)
Sodium: 139 mmol/L (ref 134–144)
Total Protein: 7.1 g/dL (ref 6.0–8.5)

## 2020-04-12 LAB — CBC WITH DIFFERENTIAL/PLATELET
Basophils Absolute: 0 10*3/uL (ref 0.0–0.2)
Basos: 1 %
EOS (ABSOLUTE): 0.3 10*3/uL (ref 0.0–0.4)
Eos: 5 %
Hematocrit: 43.8 % (ref 37.5–51.0)
Hemoglobin: 15.1 g/dL (ref 13.0–17.7)
Immature Grans (Abs): 0 10*3/uL (ref 0.0–0.1)
Immature Granulocytes: 0 %
Lymphocytes Absolute: 2.4 10*3/uL (ref 0.7–3.1)
Lymphs: 42 %
MCH: 31.3 pg (ref 26.6–33.0)
MCHC: 34.5 g/dL (ref 31.5–35.7)
MCV: 91 fL (ref 79–97)
Monocytes Absolute: 0.5 10*3/uL (ref 0.1–0.9)
Monocytes: 8 %
Neutrophils Absolute: 2.6 10*3/uL (ref 1.4–7.0)
Neutrophils: 44 %
Platelets: 214 10*3/uL (ref 150–450)
RBC: 4.83 x10E6/uL (ref 4.14–5.80)
RDW: 11.8 % (ref 11.6–15.4)
WBC: 5.8 10*3/uL (ref 3.4–10.8)

## 2020-04-15 ENCOUNTER — Other Ambulatory Visit: Payer: Self-pay | Admitting: Pharmacist

## 2020-04-15 ENCOUNTER — Telehealth: Payer: Self-pay | Admitting: Infectious Diseases

## 2020-04-15 DIAGNOSIS — B182 Chronic viral hepatitis C: Secondary | ICD-10-CM

## 2020-04-15 MED ORDER — SOFOSBUVIR-VELPATASVIR 400-100 MG PO TABS: 1.0000 | ORAL_TABLET | Freq: Every day | ORAL | 2 refills | Status: AC

## 2020-04-15 NOTE — Telephone Encounter (Signed)
Spoke to patient regarding his recent labs and plan for getting sof/vel ( EPClusa) approved by his insurance for treatment of hepc. Spoke to [employer] at [facility] pharmacy and she will get approval and afterwards speak with patient. 'Told patient to stop alcohol and cut down/stop Kratom.

## 2020-04-15 NOTE — Progress Notes (Signed)
Sending Epclusa x 12 weeks to [facility] to begin PA approval process. Will update patient once approved.

## 2020-04-16 ENCOUNTER — Telehealth: Payer: Self-pay

## 2020-04-16 NOTE — Telephone Encounter (Signed)
[facility] Patient Advocate Encounter   Received notification from [facility] that prior authorization for EPCLUSA is required.   PA submitted on 04/16/20 Key [id number] Status is pending    [facility] Clinic will continue to follow.   Clearance Coots, CPhT Specialty Pharmacy Patient [facility] for Infectious Disease Phone: [phone] Fax:  [phone]

## 2020-04-21 ENCOUNTER — Telehealth: Payer: Self-pay | Admitting: Pharmacist

## 2020-04-21 ENCOUNTER — Telehealth: Payer: Self-pay

## 2020-04-21 MED FILL — EPCLUSA 400 MG-100 MG TAB: 400-100 | 28 days supply | Qty: 28 | Fill #0

## 2020-04-21 NOTE — Telephone Encounter (Signed)
[facility] Patient Advocate Encounter  Prior Authorization for Dorita Fray  has been approved.    PA# [id number] Effective dates: 04/18/20 through 07/16/20  Patients co-pay is $250.00.   I received a Co-Pay Coupon Card    That will make Patient Co-Pay $0.00.   Clearance Coots, CPhT Specialty Pharmacy Patient [facility] for Infectious Disease Phone: [phone] Fax:  [phone]

## 2020-04-21 NOTE — Telephone Encounter (Signed)
Patient is approved to receive Epclusa x 12 weeks for chronic Hepatitis C infection. Counseled patient to take Epclusa daily with or without food. Encouraged patient not to miss any doses and explained how their chance of cure could go down with each dose missed. Counseled patient on what to do if dose is missed - if it is closer to the missed dose take immediately; if closer to next dose skip dose and take the next dose at the usual time.   Counseled patient on common side effects such as headache, fatigue, and nausea and that these normally decrease with time. I reviewed patient medications and found no drug interactions, but patient did tell me that he takes intermittent Prilosec. Advised patient to try to avoid any acid suppressants but to separate by at least 4-6 hours if he does have to take it.  Instructed patient to call clinic if he wishes to start a new medication during course of therapy. Also advised patient to call if he experiences any side effects. Patient will follow-up with Dr. Rivka Safer at [facility]. He will call her office to make appointment.

## 2020-04-21 NOTE — Telephone Encounter (Signed)
Thanks Cassie, Appreciate your help

## 2020-04-22 NOTE — Telephone Encounter (Signed)
No problem, anytime.

## 2020-05-12 ENCOUNTER — Telehealth: Payer: Self-pay

## 2020-05-12 ENCOUNTER — Other Ambulatory Visit: Payer: Self-pay | Admitting: Infectious Diseases

## 2020-05-12 DIAGNOSIS — B182 Chronic viral hepatitis C: Secondary | ICD-10-CM

## 2020-05-12 NOTE — Telephone Encounter (Signed)
  Call patient and ask whether he started Hepatitis C treatment and how he is doing. Patient needs LFTS checked 2 weeks after starting treatment. also ask him where he wants to go for labs-       Left VM with him and his wife.

## 2020-05-12 NOTE — Telephone Encounter (Signed)
Patient to go to [facility] after 2 weeks on treatment. He will come in for 5 week follow up  06/05/20.

## 2020-05-22 MED FILL — EPCLUSA 400 MG-100 MG TAB: 400-100 | 28 days supply | Qty: 28 | Fill #1

## 2020-06-05 ENCOUNTER — Ambulatory Visit: Payer: No Typology Code available for payment source | Admitting: Infectious Diseases

## 2020-06-23 MED FILL — EPCLUSA 400 MG-100 MG TAB: 400-100 | 28 days supply | Qty: 28 | Fill #2

## 2020-07-24 ENCOUNTER — Other Ambulatory Visit (HOSPITAL_COMMUNITY): Payer: Self-pay

## 2022-03-18 IMAGING — US US ABDOMEN COMPLETE W/ ELASTOGRAPHY
1 series · 12 of 25 positions shown · non-contrast
Comparison: None

CLINICAL DATA: Chronic hepatitis-C without hepatic coma, history of
drug abuse

EXAM:
ULTRASOUND ABDOMEN
ULTRASOUND HEPATIC ELASTOGRAPHY
TECHNIQUE: Sonography of the upper abdomen was performed. In addition,
ultrasound elastography evaluation of the liver was performed. A
region of interest was placed within the right lobe of the liver.
Following application of a compressive sonographic pulse, tissue
compressibility was assessed. Multiple assessments were performed at
the selected site. Median tissue compressibility was determined.
Previously, hepatic stiffness was assessed by shear wave velocity.
Based on recently published Society of Radiologists in Ultrasound
consensus article, reporting is now recommended to be performed in
the SI units of pressure (kiloPascals) representing hepatic
stiffness/elasticity. The obtained result is compared to the
published reference standards. (cACLD = compensated Advanced Chronic
Liver Disease)

[Series 1: us abdomen complete w/elastography · 12 of 116 slices shown]
[im 5/116]
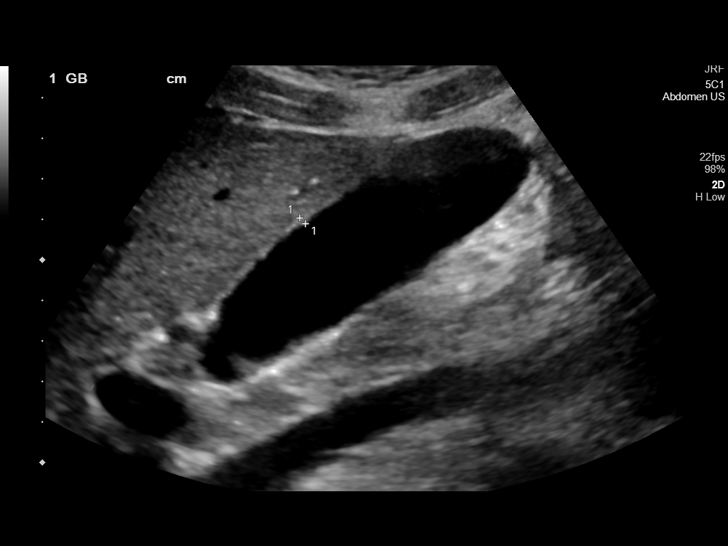
[im 15/116]
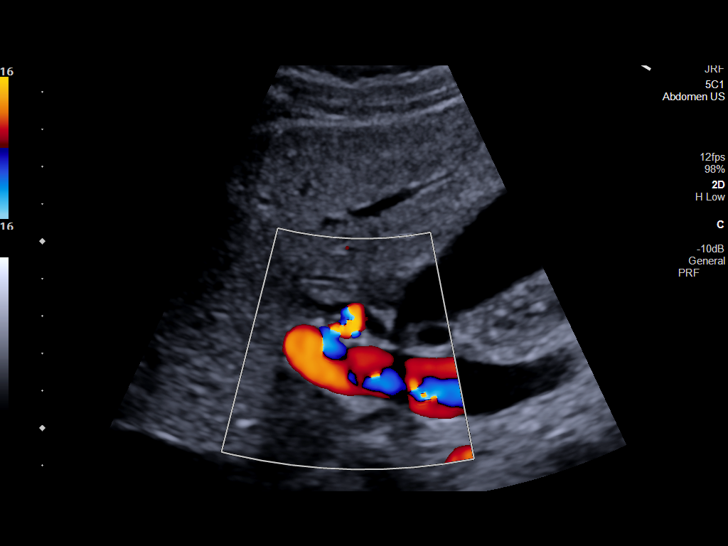
[im 24/116]
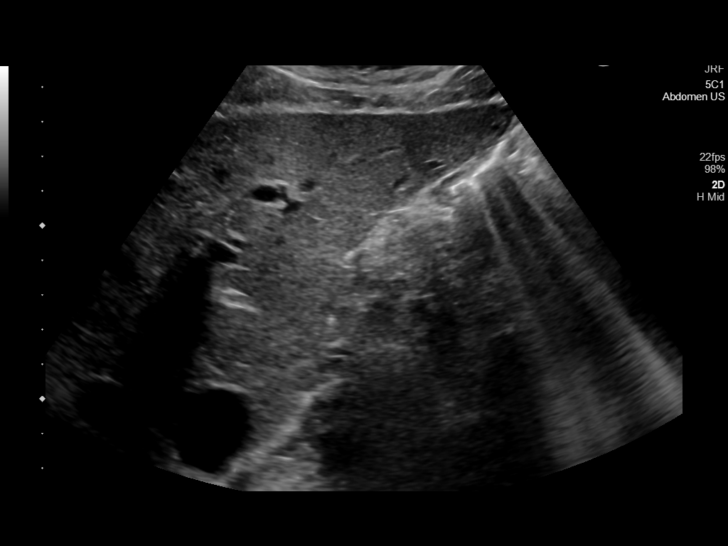
[im 34/116]
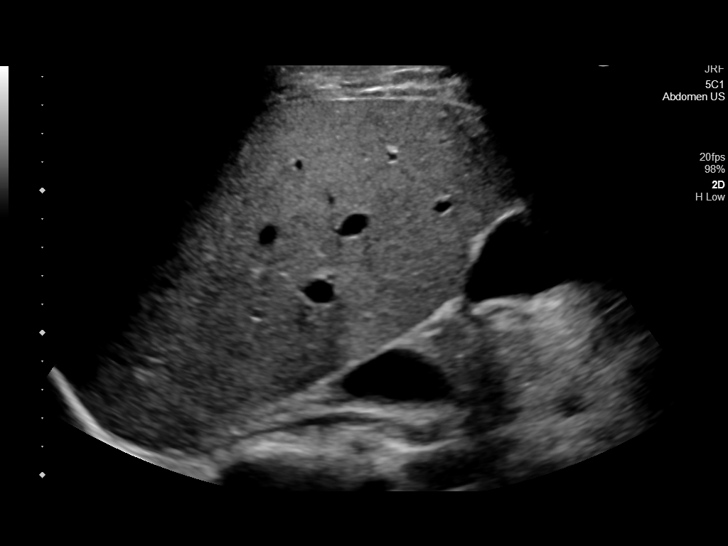
[im 44/116]
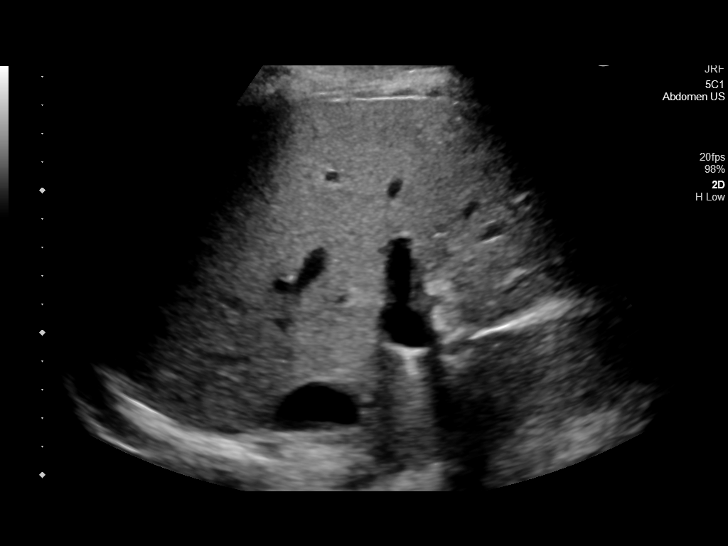
[im 53/116]
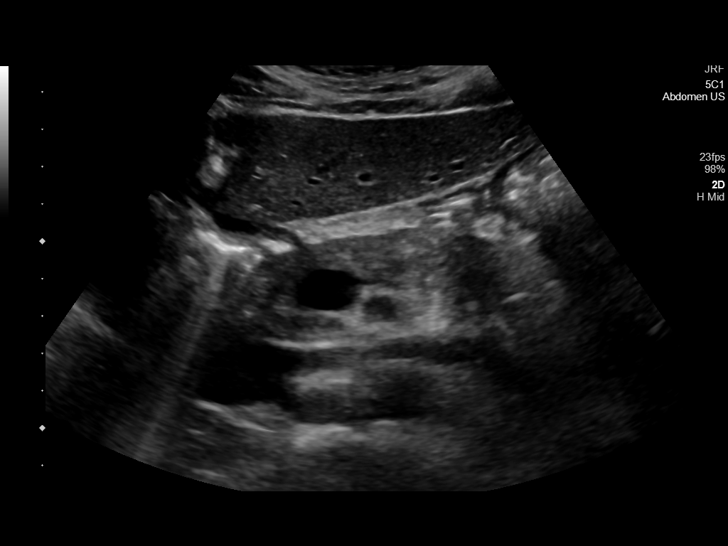
[im 63/116]
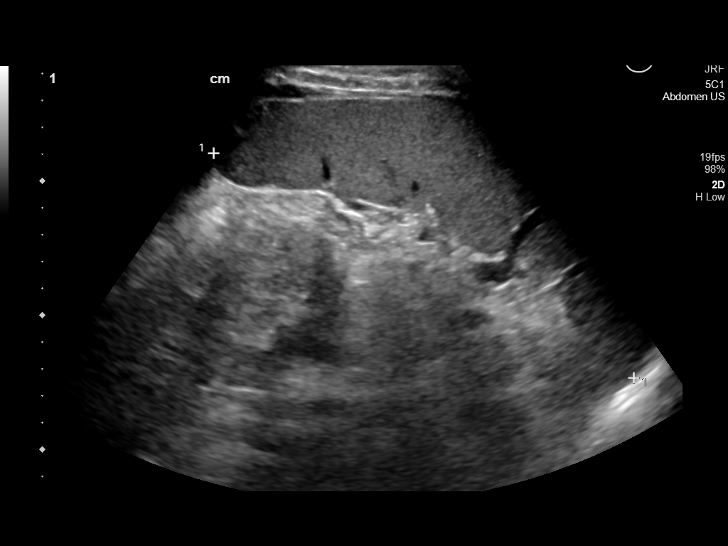
[im 72/116]
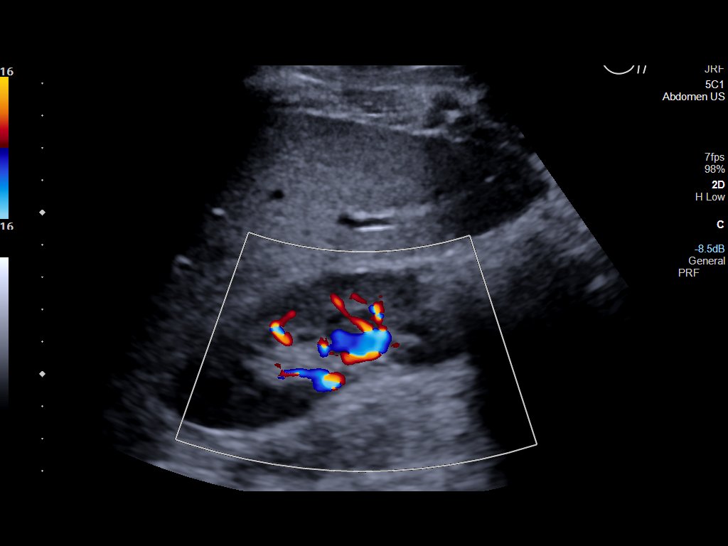
[im 82/116]
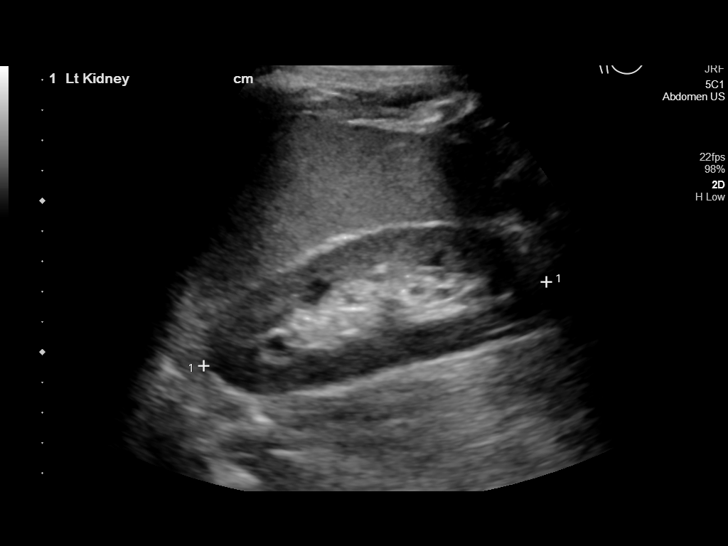
[im 92/116]
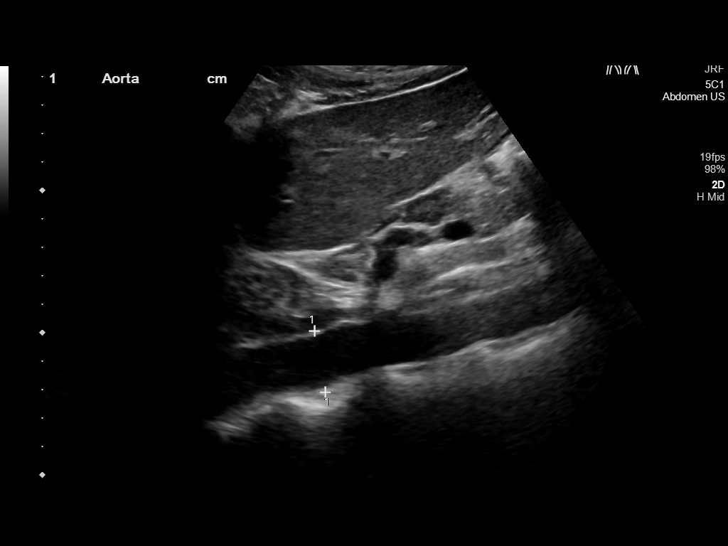
[im 101/116]
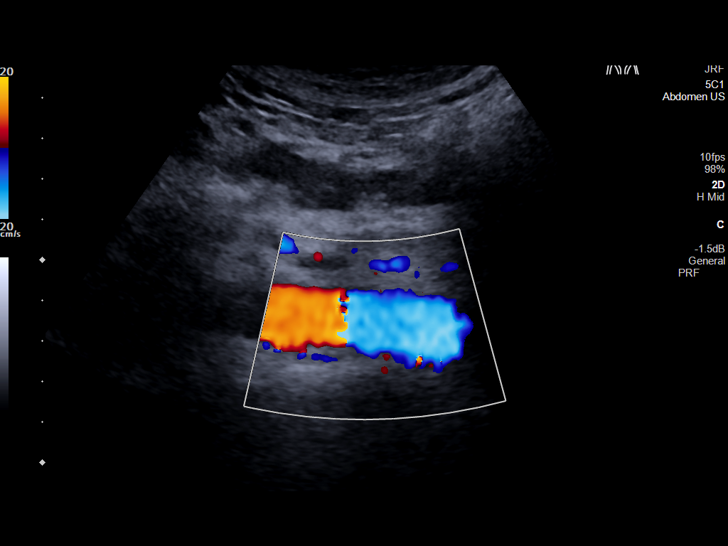
[im 111/116]
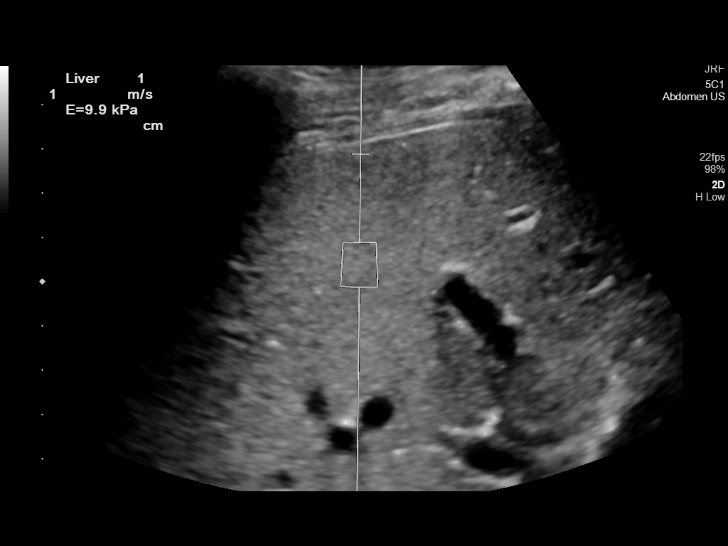

[12 of 25 positions shown; findings below may reference images not displayed]

FINDINGS: ULTRASOUND ABDOMEN

Gallbladder: Normally distended without stones or wall thickening.
No pericholecystic fluid or sonographic Murphy sign.

Common bile duct: Diameter: 3 mm, normal

Liver: Echogenic parenchyma, question fatty infiltration though this
can be seen with cirrhosis and certain infiltrative disorders. No
focal hepatic mass or nodularity. No intrahepatic biliary
dilatation. Portal vein is patent on color Doppler imaging with
normal direction of blood flow towards the liver.

IVC: Normal appearance

Pancreas: Normal appearance

Spleen: Enlarged, 17.3 cm length, calculated volume = 880 mL. No
focal mass.

Right Kidney: Length: 11.1 cm. Normal morphology without mass or
hydronephrosis.

Left Kidney: Length: 11.7 cm. Normal morphology without mass or
hydronephrosis.

Abdominal aorta: Normal caliber

Other findings: No free fluid

ULTRASOUND HEPATIC ELASTOGRAPHY

Device: Siemens Helix VTQ

Patient position: Supine

Transducer 5C1

Number of measurements: 10

Hepatic segment:  8

Median kPa:

IQR:

IQR/Median kPa ratio:

Data quality:  Good

Diagnostic category: >9 kPa and ?13 kPa: suggestive of cACLD, but
needs further testing

The use of hepatic elastography is applicable to patients with viral
hepatitis and non-alcoholic fatty liver disease. At this time, there
is insufficient data for the referenced cut-off values and use in
other causes of liver disease, including alcoholic liver disease.
Patients, however, may be assessed by elastography and serve as
their own reference standard/baseline.

In patients with non-alcoholic liver disease, the values suggesting
compensated advanced chronic liver disease (cACLD) may be lower, and
patients may need additional testing with elasticity results of [DATE]
kPa.

Please note that abnormal hepatic elasticity and shear wave
velocities may also be identified in clinical settings other than
with hepatic fibrosis, such as: acute hepatitis, elevated right
heart and central venous pressures including use of beta blockers,
Rayne disease (Giro), infiltrative processes such as
mastocytosis/amyloidosis/infiltrative tumor/lymphoma, extrahepatic
cholestasis, with hyperemia in the post-prandial state, and with
liver transplantation. Correlation with patient history, laboratory
data, and clinical condition recommended.

Diagnostic Categories:

< or =5 kPa: high probability of being normal

< or =9 kPa: in the absence of other known clinical signs, rules [DATE] kPa and ?13 kPa: suggestive of cACLD, but needs further testing

>13 kPa: highly suggestive of cACLD

> or =17 kPa: highly suggestive of cACLD with an increased
probability of clinically significant portal hypertension
IMPRESSION: ULTRASOUND ABDOMEN:

Echogenic liver, can be seen with cirrhosis and fatty infiltration.

No hepatic mass or nodularity.

Associated splenomegaly.

ULTRASOUND HEPATIC ELASTOGRAPHY:

Median kPa:

Diagnostic category: >9 kPa and ?13 kPa: suggestive of cACLD, but
needs further testing
